# Patient Record
Sex: Male | Born: 1960 | Race: White | Hispanic: No | Marital: Married | Smoking: Current every day smoker
Health system: Southern US, Community
[De-identification: ages and names within clinical notes are randomized; demographics above are authoritative.]

## PROBLEM LIST (undated history)

## (undated) DIAGNOSIS — Z950 Presence of cardiac pacemaker: Secondary | ICD-10-CM

## (undated) HISTORY — PX: APPENDECTOMY: SHX54

## (undated) HISTORY — PX: KNEE ARTHROSCOPY: SHX127

## (undated) HISTORY — PX: PACEMAKER INSERTION: SHX728

## (undated) HISTORY — PX: HERNIA REPAIR: SHX51

---

## 2013-12-14 ENCOUNTER — Other Ambulatory Visit: Payer: Self-pay

## 2013-12-14 ENCOUNTER — Emergency Department (HOSPITAL_COMMUNITY)
Admission: EM | Admit: 2013-12-14 | Discharge: 2013-12-14 | Discharge status: Against Medical Advice | Payer: Self-pay | Attending: Emergency Medicine | Admitting: Emergency Medicine

## 2013-12-14 ENCOUNTER — Encounter (HOSPITAL_COMMUNITY): Payer: Self-pay | Admitting: Emergency Medicine

## 2013-12-14 ENCOUNTER — Emergency Department (HOSPITAL_COMMUNITY): Payer: Self-pay

## 2013-12-14 DIAGNOSIS — F17200 Nicotine dependence, unspecified, uncomplicated: Secondary | ICD-10-CM | POA: Insufficient documentation

## 2013-12-14 DIAGNOSIS — R231 Pallor: Secondary | ICD-10-CM | POA: Insufficient documentation

## 2013-12-14 DIAGNOSIS — R0789 Other chest pain: Secondary | ICD-10-CM | POA: Insufficient documentation

## 2013-12-14 DIAGNOSIS — R531 Weakness: Secondary | ICD-10-CM

## 2013-12-14 DIAGNOSIS — Z950 Presence of cardiac pacemaker: Secondary | ICD-10-CM | POA: Insufficient documentation

## 2013-12-14 DIAGNOSIS — R5381 Other malaise: Secondary | ICD-10-CM | POA: Insufficient documentation

## 2013-12-14 DIAGNOSIS — Z880 Allergy status to penicillin: Secondary | ICD-10-CM | POA: Insufficient documentation

## 2013-12-14 DIAGNOSIS — R5383 Other fatigue: Principal | ICD-10-CM

## 2013-12-14 HISTORY — DX: Presence of cardiac pacemaker: Z95.0

## 2013-12-14 LAB — TROPONIN I

## 2013-12-14 LAB — COMPREHENSIVE METABOLIC PANEL
ALK PHOS: 64 U/L (ref 39–117)
ALT: 15 U/L (ref 0–53)
ANION GAP: 10 (ref 5–15)
AST: 15 U/L (ref 0–37)
Albumin: 4 g/dL (ref 3.5–5.2)
BILIRUBIN TOTAL: 0.5 mg/dL (ref 0.3–1.2)
BUN: 24 mg/dL — AB (ref 6–23)
CHLORIDE: 104 meq/L (ref 96–112)
CO2: 27 mEq/L (ref 19–32)
CREATININE: 0.88 mg/dL (ref 0.50–1.35)
Calcium: 9.2 mg/dL (ref 8.4–10.5)
GFR calc Af Amer: >90 mL/min (ref >90)
GFR calc non Af Amer: >90 mL/min (ref >90)
Glucose, Bld: 89 mg/dL (ref 70–99)
POTASSIUM: 4.3 meq/L (ref 3.7–5.3)
Sodium: 141 mEq/L (ref 137–147)
Total Protein: 6.6 g/dL (ref 6.0–8.3)

## 2013-12-14 LAB — CBC WITH DIFFERENTIAL/PLATELET
BASOS ABS: 0 10*3/uL (ref 0.0–0.1)
Basophils Relative: 0 % (ref 0–1)
EOS ABS: 0.1 10*3/uL (ref 0.0–0.7)
Eosinophils Relative: 2 % (ref 0–5)
HCT: 43.1 % (ref 39.0–52.0)
Hemoglobin: 14.6 g/dL (ref 13.0–17.0)
Lymphocytes Relative: 49 % — ABNORMAL HIGH (ref 12–46)
Lymphs Abs: 3.5 10*3/uL (ref 0.7–4.0)
MCH: 29.3 pg (ref 26.0–34.0)
MCHC: 33.9 g/dL (ref 30.0–36.0)
MCV: 86.5 fL (ref 78.0–100.0)
Monocytes Absolute: 0.5 10*3/uL (ref 0.1–1.0)
Monocytes Relative: 7 % (ref 3–12)
NEUTROS ABS: 3 10*3/uL (ref 1.7–7.7)
NEUTROS PCT: 42 % — AB (ref 43–77)
PLATELETS: 211 10*3/uL (ref 150–400)
RBC: 4.98 MIL/uL (ref 4.22–5.81)
RDW: 13.3 % (ref 11.5–15.5)
WBC: 7.1 10*3/uL (ref 4.0–10.5)

## 2013-12-14 MED ORDER — ACETAMINOPHEN 500 MG PO TABS
1000.0000 mg | ORAL_TABLET | Freq: Once | ORAL | Status: DC
  Filled 2013-12-14: qty 2

## 2013-12-14 MED ORDER — IBUPROFEN 800 MG PO TABS: 800.0000 mg | ORAL_TABLET | Freq: Once | ORAL | Status: DC

## 2013-12-14 NOTE — ED Notes (Signed)
Patient states that he would like his IV removed and that he wants to leave. States that he can take Tylenol and Ibuprofen at home. IV removed. Patient left ambulatory. Dr. Adriana Simasook aware.

## 2013-12-14 NOTE — ED Notes (Signed)
Moved family here, not had any help. Feeling really tired and chest hurting every once in a while. Pacemaker placed in February.

## 2013-12-14 NOTE — ED Provider Notes (Signed)
CSN: [id number]     Arrival date & time 12/14/13  2026 History  This chart was scribed for Brent HutchingBrian Myranda Pavone, MD by Milly JakobJohn Lee Graves, ED Scribe. The patient was seen in room APA10/APA10. Patient's care was started at 8:36 PM.     Chief Complaint  Patient presents with  . Weakness  . Chest Pain    The history is provided by the patient and the spouse. No language interpreter was used.   HPI Comments: Brent Howard is a 53 y.o. male who presents to the Emergency Department complaining of generalized weakness and sharp, intermittent, left upper chest pain. He reports a [facility] [place] and denies taking heart medication. He reports that he has a severe headache. He reports SOB since the pacemaker placement that is worsened today. He reports that he is very tired onset 1 hour ago. He reports a burning and, "sticking," sensation in the area of pacemaker in the left superior lateral chest wall.  He denies having a doctor and reports that he is here visiting relatives. He denies other health problems. He is not a drinker or a smoker.   Past Medical History  Diagnosis Date  . Pacemaker    Past Surgical History  Procedure Laterality Date  . Pacemaker insertion     No family history on file. History  Substance Use Topics  . Smoking status: Current Every Day Smoker  . Smokeless tobacco: Not on file  . Alcohol Use: No    Review of Systems  A complete 10 system review of systems was obtained and all systems are negative except as noted in the HPI and PMH.    Allergies  Penicillins  Home Medications   Prior to Admission medications   Not on File   Traige Vitals: BP 110/79  Pulse 59  Temp(Src) 97.9 F (36.6 C) (Oral)  Resp 14  Ht 5\' 11"  (1.803 m)  Wt 176 lb (79.833 kg)  BMI 24.56 kg/m2  SpO2 96% Physical Exam  Nursing note and vitals reviewed. Constitutional: He is oriented to person, place, and time. He appears  well-developed and well-nourished. No distress.  HENT:  Head: Normocephalic and atraumatic.  Eyes: Conjunctivae and EOM are normal.  Neck: Neck supple. No tracheal deviation present.  Cardiovascular: Normal rate.   Pulmonary/Chest: Effort normal. No respiratory distress. He exhibits tenderness (pacemaker in left superior lateral chest wall. minimally tender around the pacemaker site.).  Musculoskeletal: Normal range of motion.  Neurological: He is alert and oriented to person, place, and time.  Skin: Skin is warm and dry. There is pallor.  Psychiatric: He has a normal mood and affect. His behavior is normal.    ED Course  Procedures (including critical care time) DIAGNOSTIC STUDIES: Oxygen Saturation is 96% on room air, normal by my interpretation.    COORDINATION OF CARE: 8:43 PM-Discussed treatment plan which includes pacemaker interrogation with pt at bedside and pt agreed to plan.   Labs Review Results for orders placed during the hospital encounter of 12/14/13  CBC WITH DIFFERENTIAL      Result Value Ref Range   WBC 7.1  4.0 - 10.5 K/uL   RBC 4.98  4.22 - 5.81 MIL/uL   Hemoglobin 14.6  13.0 - 17.0 g/dL   HCT 13.243.1  44.039.0 - 10.252.0 %   MCV 86.5  78.0 - 100.0 fL   MCH 29.3  26.0 - 34.0 pg   MCHC 33.9  30.0 - 36.0  g/dL   RDW 45.4  09.8 - 11.9 %   Platelets 211  150 - 400 K/uL   Neutrophils Relative % 42 (*) 43 - 77 %   Neutro Abs 3.0  1.7 - 7.7 K/uL   Lymphocytes Relative 49 (*) 12 - 46 %   Lymphs Abs 3.5  0.7 - 4.0 K/uL   Monocytes Relative 7  3 - 12 %   Monocytes Absolute 0.5  0.1 - 1.0 K/uL   Eosinophils Relative 2  0 - 5 %   Eosinophils Absolute 0.1  0.0 - 0.7 K/uL   Basophils Relative 0  0 - 1 %   Basophils Absolute 0.0  0.0 - 0.1 K/uL  COMPREHENSIVE METABOLIC PANEL      Result Value Ref Range   Sodium 141  137 - 147 mEq/L   Potassium 4.3  3.7 - 5.3 mEq/L   Chloride 104  96 - 112 mEq/L   CO2 27  19 - 32 mEq/L   Glucose, Bld 89  70 - 99 mg/dL   BUN 24 (*) 6 - 23  mg/dL   Creatinine, Ser 1.47  0.50 - 1.35 mg/dL   Calcium 9.2  8.4 - 82.9 mg/dL   Total Protein 6.6  6.0 - 8.3 g/dL   Albumin 4.0  3.5 - 5.2 g/dL   AST 15  0 - 37 U/L   ALT 15  0 - 53 U/L   Alkaline Phosphatase 64  39 - 117 U/L   Total Bilirubin 0.5  0.3 - 1.2 mg/dL   GFR calc non Af Amer >90  >90 mL/min   GFR calc Af Amer >90  >90 mL/min   Anion gap 10  5 - 15  TROPONIN I      Result Value Ref Range   Troponin I <0.30  <0.30 ng/mL   No results found.  Imaging Review Dg Chest 2 View  12/14/2013   CLINICAL DATA:  Weakness and chest pain  EXAM: CHEST  2 VIEW  COMPARISON:  None.  FINDINGS: There is a dual-chamber pacer from the left. Lead have an unremarkable orientation. No definitive cardiomegaly. Upper mediastinal contours are unremarkable. No edema, consolidation, effusion, or pneumothorax. A density projecting posterior and inferior to the carina in the lateral projection is likely central pulmonary vessels. No nodule seen in the frontal projection.  IMPRESSION: No active cardiopulmonary disease.   Electronically Signed   By: Tiburcio Pea M.D.   On: 12/14/2013 22:33     EKG Interpretation None      Date: 12/14/2013  Rate: 60  Rhythm: normal sinus rhythm  QRS Axis: normal  Intervals: normal  ST/T Wave abnormalities: normal  Conduction Disutrbances: none  Narrative Interpretation: unremarkable    MDM   Final diagnoses:  Weakness   Pt left AMA.  I attempted to get him to stay to finish his workup, but he refused.  I was in the process of reviewing his pacemaker interrogation. Pt understands the seriousness of his problem. I personally performed the services described in this documentation, which was scribed in my presence. The recorded information has been reviewed and is accurate.    Brent Hutching, MD 12/14/13 [phone]

## 2013-12-14 NOTE — ED Notes (Signed)
Patient states that he has a headache that started an hour ago. Also has an insect bite to lower left chest.

## 2014-01-19 ENCOUNTER — Emergency Department (HOSPITAL_COMMUNITY)
Admission: EM | Admit: 2014-01-19 | Discharge: 2014-01-19 | Disposition: A | Discharge status: Home / Self Care | Payer: Self-pay | Attending: Emergency Medicine | Admitting: Emergency Medicine

## 2014-01-19 ENCOUNTER — Encounter (HOSPITAL_COMMUNITY): Payer: Self-pay | Admitting: Emergency Medicine

## 2014-01-19 DIAGNOSIS — W57XXXA Bitten or stung by nonvenomous insect and other nonvenomous arthropods, initial encounter: Secondary | ICD-10-CM

## 2014-01-19 DIAGNOSIS — IMO0002 Reserved for concepts with insufficient information to code with codable children: Secondary | ICD-10-CM | POA: Insufficient documentation

## 2014-01-19 DIAGNOSIS — T63484A Toxic effect of venom of other arthropod, undetermined, initial encounter: Secondary | ICD-10-CM

## 2014-01-19 DIAGNOSIS — T63461A Toxic effect of venom of wasps, accidental (unintentional), initial encounter: Secondary | ICD-10-CM | POA: Insufficient documentation

## 2014-01-19 DIAGNOSIS — Y929 Unspecified place or not applicable: Secondary | ICD-10-CM | POA: Insufficient documentation

## 2014-01-19 DIAGNOSIS — Z880 Allergy status to penicillin: Secondary | ICD-10-CM | POA: Insufficient documentation

## 2014-01-19 DIAGNOSIS — Y939 Activity, unspecified: Secondary | ICD-10-CM | POA: Insufficient documentation

## 2014-01-19 DIAGNOSIS — Z950 Presence of cardiac pacemaker: Secondary | ICD-10-CM | POA: Insufficient documentation

## 2014-01-19 DIAGNOSIS — F17200 Nicotine dependence, unspecified, uncomplicated: Secondary | ICD-10-CM | POA: Insufficient documentation

## 2014-01-19 DIAGNOSIS — T6391XA Toxic effect of contact with unspecified venomous animal, accidental (unintentional), initial encounter: Secondary | ICD-10-CM | POA: Insufficient documentation

## 2014-01-19 MED ORDER — PREDNISONE 10 MG PO TABS
60.0000 mg | ORAL_TABLET | Freq: Once | ORAL | Status: AC
  Administered 2014-01-19: 60 mg via ORAL
  Filled 2014-01-19 (×2): qty 1

## 2014-01-19 MED ORDER — PREDNISONE 10 MG PO TABS: ORAL_TABLET | ORAL | Status: DC

## 2014-01-19 MED ORDER — HYDROCODONE-ACETAMINOPHEN 5-325 MG PO TABS
1.0000 | ORAL_TABLET | Freq: Once | ORAL | Status: AC
  Administered 2014-01-19: 1 via ORAL
  Filled 2014-01-19: qty 1

## 2014-01-19 MED ORDER — HYDROCODONE-ACETAMINOPHEN 5-325 MG PO TABS: 1.0000 | ORAL_TABLET | ORAL | Status: DC | PRN

## 2014-01-19 NOTE — ED Notes (Signed)
Pt with swelling to left forearm from where a bee stung him this morning, pt states he has taken 4 benadryl today and applied ice to site, refused ice pack here, c/o pain to site, pt denies SOB or tongue swelling

## 2014-01-19 NOTE — ED Provider Notes (Signed)
CSN: [id number]     Arrival date & time 01/19/14  1829 History   First MD Initiated Contact with Patient 01/19/14 1853     Chief Complaint  Patient presents with  . Insect Bite     (Consider location/radiation/quality/duration/timing/severity/associated sxs/prior Treatment) The history is provided by the patient.   Brent Howard is a 53 y.o. male presenting with a painful insect sting to his left medial forearm occurring around 9 AM this morning.  He did not see the insect but suspects it was either a bee or hornet.  He has applied ice to the area and has also taking 4 doses of Benadryl today.  He continues to be red, swollen and also itchy and very tender without radiation of pain.  The swelling is limited to his medial mid forearm.  He denies shortness of breath, wheezing, mouth or facial swelling.  He denies allergy to bee stings.     Past Medical History  Diagnosis Date  . Pacemaker    Past Surgical History  Procedure Laterality Date  . Pacemaker insertion    . Hernia repair    . Knee arthroscopy    . Appendectomy     History reviewed. No pertinent family history. History  Substance Use Topics  . Smoking status: Current Every Day Smoker -- 0.75 packs/day  . Smokeless tobacco: Not on file  . Alcohol Use: No    Review of Systems  Constitutional: Negative for fever and chills.  Respiratory: Negative for shortness of breath and wheezing.   Skin: Positive for color change. Negative for rash.  Neurological: Negative for numbness.      Allergies  Penicillins  Home Medications   Prior to Admission medications   Medication Sig Start Date End Date Taking? Authorizing Provider  HYDROcodone-acetaminophen (NORCO/VICODIN) 5-325 MG per tablet Take 1 tablet by mouth every 4 (four) hours as needed for moderate pain. 01/19/14   Burgess Amor, PA-C  predniSONE (DELTASONE) 10 MG tablet 6, 5, 4, 3, 2 then 1 tablet by mouth daily for 6 days total. 01/19/14   Burgess Amor, PA-C   BP  121/79  Pulse 80  Temp(Src) 99 F (37.2 C) (Oral)  Resp 20  Ht 5\' 10"  (1.778 m)  Wt 179 lb (81.194 kg)  BMI 25.68 kg/m2  SpO2 98% Physical Exam  Constitutional: He appears well-developed and well-nourished. No distress.  HENT:  Head: Normocephalic.  Mouth/Throat: Uvula is midline and oropharynx is clear and moist.  Neck: Neck supple.  Cardiovascular: Normal rate.   Pulmonary/Chest: Effort normal. He has no decreased breath sounds. He has no wheezes.  No stridor  Musculoskeletal: Normal range of motion. He exhibits no edema.  Skin: Skin is warm. There is erythema.  Left volar forearm is moderately tender, slightly edematous mid forearm without red streaking or induration.  It is slightly pink in coloration.  There is a central tiny punctum without retained foreign body.    ED Course  Procedures (including critical care time) Labs Review Labs Reviewed - No data to display  Imaging Review No results found.   EKG Interpretation None      MDM   Final diagnoses:  Insect bites and stings, undetermined intent, initial encounter    Patient was encouraged to continue with ice packs and Benadryl.  He was prescribed a prednisone 6 day taper with first dose given here.  Also prescribed a few hydrocodone tablets.  He is in no respiratory distress.  He displays localized reaction to an insect  sting without complication.  Encouraged recheck here for any worsened symptoms.    Burgess AmorJulie Fleda Pagel, PA-C 01/19/14 2020

## 2014-01-19 NOTE — ED Notes (Signed)
Pt verbalized understanding of no driving within 4 hours of taking pain med due to med may cause drowsiness  

## 2014-01-19 NOTE — Discharge Instructions (Signed)
Bee, Wasp, or Hornet Sting Your caregiver has diagnosed you as having an insect sting. An insect sting appears as a red lump in the skin that sometimes has a tiny hole in the center, or it may have a stinger in the center of the wound. The most common stings are from wasps, hornets and bees. Individuals have different reactions to insect stings.  A normal reaction may cause pain, swelling, and redness around the sting site.  A localized allergic reaction may cause swelling and redness that extends beyond the sting site.  A large local reaction may continue to develop over the next 12 to 36 hours.  On occasion, the reactions can be severe (anaphylactic reaction). An anaphylactic reaction may cause wheezing; difficulty breathing; chest pain; fainting; raised, itchy, red patches on the skin; a sick feeling to your stomach (nausea); vomiting; cramping; or diarrhea. If you have had an anaphylactic reaction to an insect sting in the past, you are more likely to have one again. HOME CARE INSTRUCTIONS   With bee stings, a small sac of poison is left in the wound. Brushing across this with something such as a credit card, or anything similar, will help remove this and decrease the amount of the reaction. This same procedure will not help a wasp sting as they do not leave behind a stinger and poison sac.  Apply a cold compress for 10 to 20 minutes every hour for 1 to 2 days, depending on severity, to reduce swelling and itching.  To lessen pain, a paste made of water and baking soda may be rubbed on the bite or sting and left on for 5 minutes.  To relieve itching and swelling, you may use take medication or apply medicated creams or lotions as directed.  Only take over-the-counter or prescription medicines for pain, discomfort, or fever as directed by your caregiver.  Wash the sting site daily with soap and water. Apply antibiotic ointment on the sting site as directed.  If you suffered a severe  reaction:  If you did not require hospitalization, an adult will need to stay with you for 24 hours in case the symptoms return.  You may need to wear a medical bracelet or necklace stating the allergy.  You and your family need to learn when and how to use an anaphylaxis kit or epinephrine injection.  If you have had a severe reaction before, always carry your anaphylaxis kit with you. SEEK MEDICAL CARE IF:   None of the above helps within 2 to 3 days.  The area becomes red, warm, tender, and swollen beyond the area of the bite or sting.  You have an oral temperature above 102 F (38.9 C). SEEK IMMEDIATE MEDICAL CARE IF:  You have symptoms of an allergic reaction which are:  Wheezing.  Difficulty breathing.  Chest pain.  Lightheadedness or fainting.  Itchy, raised, red patches on the skin.  Nausea, vomiting, cramping or diarrhea. ANY OF THESE SYMPTOMS MAY REPRESENT A SERIOUS PROBLEM THAT IS AN EMERGENCY. Do not wait to see if the symptoms will go away. Get medical help right away. Call your local emergency services (911 in [place] DO NOT drive yourself to the hospital. MAKE SURE YOU:   Understand these instructions.  Will watch your condition.  Will get help right away if you are not doing well or get worse. Document Released: 05/19/2005 Document Revised: 08/11/2011 Document Reviewed: 11/03/2009 [facility] Patient Information 2015 [place], [place]. This information is not intended to replace advice given  to you by your health care provider. Make sure you discuss any questions you have with your health care provider.   Take your next dose of prednisone tomorrow evening. Use caution with hydrocodone as this medication will make you drowsy.  Continue using your Benadryl and tell itching and swelling is improved.  Ice packs are also recommended and you may continue using this treatment.  Return here for any worsened symptoms.

## 2014-01-19 NOTE — ED Notes (Signed)
Pt reports was stung by a bee at 9 am this morning. Pt reports took x4 benadryl with no relief. Pt reports nausea. Airway patent. nad noted. Mild swelling and redness noted to left forearm.

## 2014-01-20 NOTE — ED Provider Notes (Signed)
Medical screening examination/treatment/procedure(s) were performed by non-physician practitioner and as supervising physician I was immediately available for consultation/collaboration.   EKG Interpretation None        Vanetta MuldersScott Bernyce Brimley, MD 01/20/14 [id number]

## 2014-03-24 ENCOUNTER — Emergency Department (HOSPITAL_COMMUNITY)
Admission: EM | Admit: 2014-03-24 | Discharge: 2014-03-24 | Disposition: A | Discharge status: Home / Self Care | Payer: Self-pay | Attending: Emergency Medicine | Admitting: Emergency Medicine

## 2014-03-24 ENCOUNTER — Emergency Department (HOSPITAL_COMMUNITY): Payer: Self-pay

## 2014-03-24 ENCOUNTER — Encounter (HOSPITAL_COMMUNITY): Payer: Self-pay | Admitting: Emergency Medicine

## 2014-03-24 DIAGNOSIS — Z950 Presence of cardiac pacemaker: Secondary | ICD-10-CM | POA: Insufficient documentation

## 2014-03-24 DIAGNOSIS — R42 Dizziness and giddiness: Secondary | ICD-10-CM

## 2014-03-24 DIAGNOSIS — R51 Headache: Secondary | ICD-10-CM | POA: Insufficient documentation

## 2014-03-24 DIAGNOSIS — Z880 Allergy status to penicillin: Secondary | ICD-10-CM | POA: Insufficient documentation

## 2014-03-24 DIAGNOSIS — R112 Nausea with vomiting, unspecified: Secondary | ICD-10-CM | POA: Insufficient documentation

## 2014-03-24 DIAGNOSIS — J069 Acute upper respiratory infection, unspecified: Secondary | ICD-10-CM | POA: Insufficient documentation

## 2014-03-24 DIAGNOSIS — I959 Hypotension, unspecified: Secondary | ICD-10-CM | POA: Insufficient documentation

## 2014-03-24 DIAGNOSIS — M549 Dorsalgia, unspecified: Secondary | ICD-10-CM | POA: Insufficient documentation

## 2014-03-24 DIAGNOSIS — Z720 Tobacco use: Secondary | ICD-10-CM | POA: Insufficient documentation

## 2014-03-24 DIAGNOSIS — Z79899 Other long term (current) drug therapy: Secondary | ICD-10-CM | POA: Insufficient documentation

## 2014-03-24 LAB — CBC WITH DIFFERENTIAL/PLATELET
Basophils Absolute: 0 10*3/uL (ref 0.0–0.1)
Basophils Relative: 0 % (ref 0–1)
Eosinophils Absolute: 0 10*3/uL (ref 0.0–0.7)
Eosinophils Relative: 0 % (ref 0–5)
HEMATOCRIT: 35.9 % — AB (ref 39.0–52.0)
Hemoglobin: 12.2 g/dL — ABNORMAL LOW (ref 13.0–17.0)
LYMPHS ABS: 2 10*3/uL (ref 0.7–4.0)
Lymphocytes Relative: 21 % (ref 12–46)
MCH: 29.5 pg (ref 26.0–34.0)
MCHC: 34 g/dL (ref 30.0–36.0)
MCV: 86.7 fL (ref 78.0–100.0)
MONO ABS: 0.7 10*3/uL (ref 0.1–1.0)
Monocytes Relative: 7 % (ref 3–12)
NEUTROS ABS: 6.7 10*3/uL (ref 1.7–7.7)
NEUTROS PCT: 72 % (ref 43–77)
Platelets: 196 10*3/uL (ref 150–400)
RBC: 4.14 MIL/uL — AB (ref 4.22–5.81)
RDW: 13.5 % (ref 11.5–15.5)
WBC: 9.4 10*3/uL (ref 4.0–10.5)

## 2014-03-24 LAB — COMPREHENSIVE METABOLIC PANEL
ALK PHOS: 54 U/L (ref 39–117)
ALT: 12 U/L (ref 0–53)
AST: 10 U/L (ref 0–37)
Albumin: 3 g/dL — ABNORMAL LOW (ref 3.5–5.2)
Anion gap: 11 (ref 5–15)
BILIRUBIN TOTAL: 0.4 mg/dL (ref 0.3–1.2)
BUN: 13 mg/dL (ref 6–23)
CHLORIDE: 105 meq/L (ref 96–112)
CO2: 25 meq/L (ref 19–32)
CREATININE: 1.08 mg/dL (ref 0.50–1.35)
Calcium: 8.3 mg/dL — ABNORMAL LOW (ref 8.4–10.5)
GFR, EST AFRICAN AMERICAN: 89 mL/min — AB (ref >90)
GFR, EST NON AFRICAN AMERICAN: 77 mL/min — AB (ref >90)
GLUCOSE: 116 mg/dL — AB (ref 70–99)
POTASSIUM: 4.3 meq/L (ref 3.7–5.3)
Sodium: 141 mEq/L (ref 137–147)
Total Protein: 5.5 g/dL — ABNORMAL LOW (ref 6.0–8.3)

## 2014-03-24 LAB — URINE MICROSCOPIC-ADD ON

## 2014-03-24 LAB — URINALYSIS, ROUTINE W REFLEX MICROSCOPIC
Glucose, UA: NEGATIVE mg/dL
HGB URINE DIPSTICK: NEGATIVE
KETONES UR: NEGATIVE mg/dL
Leukocytes, UA: NEGATIVE
NITRITE: NEGATIVE
PROTEIN: 30 mg/dL — AB
UROBILINOGEN UA: 1 mg/dL (ref 0.0–1.0)
pH: 6 (ref 5.0–8.0)

## 2014-03-24 LAB — RAPID URINE DRUG SCREEN, HOSP PERFORMED
Amphetamines: NOT DETECTED
BARBITURATES: NOT DETECTED
Benzodiazepines: NOT DETECTED
COCAINE: NOT DETECTED
Opiates: NOT DETECTED
TETRAHYDROCANNABINOL: NOT DETECTED

## 2014-03-24 LAB — TROPONIN I

## 2014-03-24 LAB — ETHANOL

## 2014-03-24 MED ORDER — CYCLOBENZAPRINE HCL 10 MG PO TABS: 10.0000 mg | ORAL_TABLET | Freq: Two times a day (BID) | ORAL | Status: AC | PRN

## 2014-03-24 MED ORDER — ONDANSETRON HCL 4 MG/2ML IJ SOLN
4.0000 mg | Freq: Once | INTRAMUSCULAR | Status: AC
  Administered 2014-03-24: 4 mg via INTRAVENOUS
  Filled 2014-03-24: qty 2

## 2014-03-24 MED ORDER — MORPHINE SULFATE 4 MG/ML IJ SOLN
4.0000 mg | Freq: Once | INTRAMUSCULAR | Status: AC
  Administered 2014-03-24: 4 mg via INTRAVENOUS
  Filled 2014-03-24: qty 1

## 2014-03-24 MED ORDER — AZITHROMYCIN 250 MG PO TABS: ORAL_TABLET | ORAL | Status: AC

## 2014-03-24 MED ORDER — HYDROCODONE-ACETAMINOPHEN 5-325 MG PO TABS: 1.0000 | ORAL_TABLET | ORAL | Status: AC | PRN

## 2014-03-24 NOTE — ED Notes (Signed)
Interrogated medtronic pacer.

## 2014-03-24 NOTE — ED Notes (Signed)
Patient with no complaints at this time. Respirations even and unlabored. Skin warm/dry. Discharge instructions reviewed with patient at this time. Patient given opportunity to voice concerns/ask questions. IV removed per policy and band-aid applied to site. Patient discharged at this time and left Emergency Department with steady gait.  

## 2014-03-24 NOTE — ED Notes (Signed)
See arrival note

## 2014-03-24 NOTE — ED Notes (Signed)
Pacemaker was placed in Feb 2015.  Wife states he was checked in April and battery life had dropped 3 years.

## 2014-03-24 NOTE — ED Notes (Signed)
Report from [employer] recv'd. Report given to Dr. Adriana Simasook.

## 2014-03-24 NOTE — Discharge Instructions (Signed)
Pacemaker battery is working normally. You need to get a primary care Dr.    Chest x-ray shows the possibility of a mild infection. Prescription for antibiotic, pain medication, muscle relaxer.    Emergency Department Resource Guide 1) Find a Doctor and Pay Out of Pocket Although you won't have to find out who is covered by your insurance plan, it is a good idea to ask around and get recommendations. You will then need to call the office and see if the doctor you have chosen will accept you as a new patient and what types of options they offer for patients who are self-pay. Some doctors offer discounts or will set up payment plans for their patients who do not have insurance, but you will need to ask so you aren't surprised when you get to your appointment.  2) Contact Your Local Health Department Not all health departments have doctors that can see patients for sick visits, but many do, so it is worth a call to see if yours does. If you don't know where your local health department is, you can check in your phone book. The CDC also has a tool to help you locate your state's health department, and many state websites also have listings of all of their local health departments.  3) Find a Walk-in Clinic If your illness is not likely to be very severe or complicated, you may want to try a walk in clinic. These are popping up all over the country in pharmacies, drugstores, and shopping centers. They're usually staffed by nurse practitioners or physician assistants that have been trained to treat common illnesses and complaints. They're usually fairly quick and inexpensive. However, if you have serious medical issues or chronic medical problems, these are probably not your best option.  No Primary Care Doctor: - Call Health Connect at  [phone] - they can help you locate a primary care doctor that  accepts your insurance, provides certain services, etc. - Physician Referral Service-  [phone]  Chronic Pain Problems: Organization         Address  Phone   Notes  [facility]  [phone]) [phone] Patients need to be referred by their primary care doctor.   Medication Assistance: Organization         Address  Phone   Notes  [facility] [address] [place], [place] [address] [phone]) [phone] --Must be a resident of [place] -- Must have NO insurance coverage whatsoever (no Medicaid/ Medicare, etc.) -- The pt. MUST have a primary care doctor that directs their care regularly and follows them in the community   MedAssist  [phone]   [employer]  [phone]    Agencies that provide inexpensive medical care: Organization         Address  Phone   Notes  [facility]  [phone]   [facility]    [phone]   [facility] [address] [place], [place] [address] [phone]) [phone]   [facility] [address], [place] [phone]   [facility]    [phone](336) [phone]   [facility]    [phone]) [phone]   [facility]  [address], [place] Phone:  [phone], Fax:  [phone] Hours of Operation:  9 am - 6 pm, M-F.  Also accepts Medicaid/Medicare and self-pay.  [facility]  [address] [place], [address], [place] Phone: [phone], Fax: [phone]. Hours of Operation:  8:30 am - 5:30 pm, M-F.  Also accepts Medicaid and self-pay.  [facility] [address], [place] Phone: [phone]   [place], [place], [place] [phone], Ext. [phone] Mondays & Thursdays: 7-9 AM.  First 15 patients are seen on a first come, first serve basis.    [place] Providers:  Organization         Address  Phone   Notes  [facility] [address],  [place] [phone] Also accepts self-pay patients.  [facility] [address], [place]  [phone]   [place], [address], [place] [phone]   [facility] [address] [place] [phone]   Lucianne Lei [address], [place]   [phone] Only accepts [place] Access [place] patients after they have their name applied to their card.   Self-Pay (no insurance) in [facility]:  Organization         Address  Phone   Notes  Sickle Cell Patients, [facility] [place] [phone]   [facility] [place] [phone]   [facility]  [place], [place], [place] [phone]   [facility]/Dr. Osei-Bonsu  [address], [place] or [address], [place] [phone] Phone number for both [place] and [place] locations is the same.  [facility] [address], [place] [phone]   [facility] [address] [place] or [address] [phone]   [facility] [address] [place] [phone], phone; [phone], fax Sees patients 1st and 3rd Saturday of every month.  Must not qualify for public or private insurance (i.e. Medicaid, Medicare, [place] Health Choice, Veterans' Benefits)  Household income should be no more than 200% of the poverty level The clinic cannot treat you if you are pregnant or think you are pregnant  Sexually transmitted diseases are not treated at the clinic.    Dental Care: Organization         Address  Phone  Notes  [facility] Department of [facility] [place] [phone] Accepts children up to age 50 who are enrolled in  [place] or [place]; pregnant women with a Medicaid card; and children who have applied for Medicaid or [place] Health Choice, but were declined, whose parents can pay a reduced fee at time of service.  [facility] Department of [facility]  [address], [place] [phone] Accepts children up to age 26 who are enrolled in [place] or [place]; pregnant women with a Medicaid card; and children who have applied for Medicaid or [place] Health Choice, but were declined, whose parents can pay a reduced fee at time of service.  [facility] [place] [facility] Access PROGRAM  [place] [phone] Patients are seen by appointment only. Walk-ins are not accepted. [place] will see patients 70 years of age and older. Monday - Tuesday (8am-5pm) Most Wednesdays (8:30-5pm) $30 per visit, cash only  [place] Adult  Dental Access PROGRAM  [address], [facility] [phone] Patients are seen by appointment only. Walk-ins are not accepted. [place] will see patients 42 years of age and older. One Wednesday Evening (Monthly: Volunteer Based).  $30 per visit, cash only  [place]  [phone] for adults; Children under age 54, call Graduate Pediatric Dentistry at [phone]. Children aged 65-14, please call [phone] to request a pediatric application.  Dental services are provided in all areas of dental care including fillings, crowns and bridges, complete and partial dentures, implants, gum treatment, root canals, and extractions. Preventive care is also provided. Treatment is provided to both adults and children. Patients are selected via a lottery and there is often a waiting list.   [facility] [address] [place]  [phone] [email or web address]   [facility] [address] [place], [place] [phone], Ext. [phone] Second and Fourth Thursday of each month, opens at 6:30  AM; Clinic ends at 9 AM.  Patients are seen on a first-come first-served basis, and a limited number are seen during each clinic.   [facility]  [address] Hillard Danker [place], [place] [phone]   Eligibility Requirements You must have lived in [place], [place], or [place] counties for at least the last three months.   You cannot be eligible for state or federal sponsored [employer], including [employer], [place], or [employer].   You generally cannot be eligible for healthcare insurance through your employer.    How to apply: Eligibility screenings are held every Tuesday and Wednesday afternoon from 1:00 pm until 4:00 pm. You do not need an appointment for the interview!  [facility] [address] [place], [place]   [place]  [place] Department  [place]  [phone]    Behavioral Health Resources in the Community: Intensive Outpatient Programs Organization         Address  Phone  Notes  [place]. [address], [place], [place] [phone]   [facility] Outpatient [address], [place], [place]   ADS: Alcohol & Drug Svcs [address] [place], [place]   [place] [address]  [place], [place] or [phone]   Substance Abuse Resources Organization         Address  Phone  Notes  Alcohol and Drug Services  [phone]   [place]  [phone]   The [place]   Chinita Pester  [phone]   Residential & Outpatient Substance Abuse Program  [phone]   Psychological Services Organization         Address  Phone  Notes  [facility] [place]  [place]  [phone]   [place] [address], [place] [phone] or  [phone]    Mobile Crisis Teams Organization         Address  Phone  Notes  [facility], Mobile Crisis Care Unit  [phone]   [facility]  [address] [place], [place]   [place] Levels [address] [place] [phone]    Self-Help/Support Groups Organization         Address  Phone             Notes  Mental  Health Assoc[facility] of [place] - variety of support groups  [place] Call for more information  Narcotics Anonymous (NA), Caring [facility] [address], [employer] [place]  2 meetings at this location   Special educational needs teacher         Address  Phone  Notes  [facility] Residential Treatment [place],    [place]  [phone]   [facility]  [address] [place] [address], [place], [place]   [place], [place] [phone] Admissions: 8am-3pm M-F  Incentives Substance [place] [address]    [place], [place] [phone]   [facility] [address] [place], [place], [place]   [facility] [address],  [place], [place]   Insight Programs - Intensive Outpatient [address], [place], [place]   [facility] [address].,  [place], [place] [phone] or [phone]   [facility] Services (RTS) [address], [place], [place] Accepts Medicaid  [facility] [address]  [place] [phone] Substance Abuse/Addiction Treatment   [facility] Organization         Address  Phone  Notes  [facility]  [phone]   Domenic Schwab, PhD [address] Arlis Porta [place], [place]   [phone] or [phone]   [facility], [place] [phone]   [facility] [address],  [place], [place] [phone] Insurance/Medicaid/sponsorship through [facility] and Families [address], [address]                                    [place], [place] [phone] [place] [address], [place] [phone]    Dr. Adele Schilder  [phone]   [facility] Dept. 1) [address] [place] 2) [place] 3)  [place], [place] [phone]  [phone]   [place] [phone] or [phone] (After Hours)

## 2014-03-24 NOTE — ED Notes (Signed)
Patient w/n/v/vertigo x 2 days.  States when he changes positions, objects around him seem to move.  States he has vomited approximately 10 x in 2 days.  States his wife told him that yesterday, he had period where he was having visual hallucinations, talking to fish on the floor and to deceased family members.  Patient has no recall of this.  Over past few weeks, he has been having periods of disequilibrium with sudden directional changes when walking, causing him to stumble.  Also reports having spell of whole body numbness/tingling yesterday and recent episodes of waking at night, feeling as if L side of face is pulling.  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  He has tenderness w/palpation in cervical spine and pain radiating to L and R shoulders.  He was recently lifting heavy sheetrock and pain began afterward.  He denies use of any illegal substance or alcohol use.  Denies fevers/chills, chest pain.

## 2014-03-24 NOTE — ED Provider Notes (Addendum)
CSN: [id number]     Arrival date & time 03/24/14  1402 History   First MD Initiated Contact with Patient 03/24/14 1504     This chart was scribed for Donnetta HutchingBrian Ailyn Gladd, MD by Arlan OrganAshley Leger, ED Scribe. This patient was seen in room APA10/APA10 and the patient's care was started 5:52 PM.   Chief Complaint  Patient presents with  . Dizziness  . Emesis  . Nausea   The history is provided by the patient. No language interpreter was used.    HPI Comments: Brent Howard is a 53 y.o. male who presents to the Emergency Department complaining of constant, moderate pain between scapula and upper back x few days. Pt states pain radiates into his head. No recent injury or trauma. However, he reports recently moving a large amount of plywood and 2 x 4's.   He also admits to occasional dizziness and URI symptoms. 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 He denies any CP, fever, chills, worsening dyspnea at this time. Pt with known allergy to Penicillins. No other concerns.  Patient states his normal systolic blood pressure is 90  Past Medical History  Diagnosis Date  . Pacemaker    Past Surgical History  Procedure Laterality Date  . Pacemaker insertion    . Hernia repair    . Knee arthroscopy    . Appendectomy     History reviewed. No pertinent family history. History  Substance Use Topics  . Smoking status: Current Every Day Smoker -- 0.75 packs/day  . Smokeless tobacco: Not on file  . Alcohol Use: No    Review of Systems  Respiratory: Positive for shortness of breath.   Cardiovascular: Negative for chest pain.  Musculoskeletal: Positive for arthralgias.  Skin: Negative for color change.  All other systems reviewed and are negative.     Allergies  Penicillins  Home Medications   Prior to Admission medications   Medication Sig Start Date End Date Taking? Authorizing Provider  azithromycin (ZITHROMAX Z-PAK) 250 MG tablet  2 po day one, then 1 daily x 4 days 03/24/14   Donnetta HutchingBrian Karion Cudd, MD  cyclobenzaprine (FLEXERIL) 10 MG tablet Take 1 tablet (10 mg total) by mouth 2 (two) times daily as needed for muscle spasms. 03/24/14   Donnetta HutchingBrian Riaan [place], MD  HYDROcodone-acetaminophen (NORCO) 5-325 MG per tablet Take 1-2 tablets by mouth every 4 (four) hours as needed. 03/24/14   Donnetta HutchingBrian Ly Bacchi, MD   Triage Vitals: BP 86/53  Pulse 58  Temp(Src) 97.3 F (36.3 C) (Oral)  Resp 19  Ht 5\' 10"  (1.778 m)  Wt 180 lb (81.647 kg)  BMI 25.83 kg/m2  SpO2 96%   Physical Exam  Nursing note and vitals reviewed. Constitutional: He is oriented to person, place, and time. He appears well-developed and well-nourished.  Hypotensive but normal for pt  HENT:  Head: Normocephalic and atraumatic.  Eyes: Conjunctivae and EOM are normal. Pupils are equal, round, and reactive to light.  Neck: Normal range of motion. Neck supple.  Cardiovascular: Normal rate, regular rhythm and normal heart sounds.   Pulmonary/Chest: Effort normal and breath sounds normal.  Abdominal: Soft. Bowel sounds are normal.  Musculoskeletal: Normal range of motion. He exhibits tenderness.  Tenderness to palpation between scapula and upper back  Neurological: He is alert and oriented to person, place, and time.  Skin: Skin is warm and dry.  Psychiatric: He has a normal mood and affect. His behavior is normal.  ED Course  Procedures (including critical care time)  DIAGNOSTIC STUDIES: Oxygen Saturation is 97% on RA, Adequate by my interpretation.    COORDINATION OF CARE: 5:52 PM- Will order CBC, CMP, Troponin I, urinalysis, CT heat without contrast, DG chest 2 view, and EKG. Will give Morphine and Zofran here in ED. Discussed treatment plan with pt at bedside and pt agreed to plan.     Labs Review Labs Reviewed  CBC WITH DIFFERENTIAL - Abnormal; Notable for the following:    RBC 4.14 (*)    Hemoglobin 12.2 (*)    HCT 35.9 (*)    All other components within normal limits   COMPREHENSIVE METABOLIC PANEL - Abnormal; Notable for the following:    Glucose, Bld 116 (*)    Calcium 8.3 (*)    Total Protein 5.5 (*)    Albumin 3.0 (*)    GFR calc non Af Amer 77 (*)    GFR calc Af Amer 89 (*)    All other components within normal limits  URINALYSIS, ROUTINE W REFLEX MICROSCOPIC - Abnormal; Notable for the following:    Specific Gravity, Urine >1.030 (*)    Bilirubin Urine SMALL (*)    Protein, ur 30 (*)    All other components within normal limits  URINE MICROSCOPIC-ADD ON - Abnormal; Notable for the following:    Bacteria, UA FEW (*)    All other components within normal limits  TROPONIN I  URINE RAPID DRUG SCREEN (HOSP PERFORMED)  ETHANOL    Imaging Review Dg Chest 2 View  03/24/2014   CLINICAL DATA:  Back pain.  EXAM: CHEST  2 VIEW  COMPARISON:  12/14/2013.  FINDINGS: Mediastinum hilar structures are normal. Mild basilar atelectasis and/or infiltrates. No pleural effusion or pneumothorax. Cardiac pacer and lead tips in right atrium right ventricle. Heart size stable. Normal pulmonary vascularity. No acute bony abnormality.  IMPRESSION: 1.  Mild bibasilar atelectasis and/or infiltrates .  2. Stable cardiomegaly. Normal pulmonary vascularity. Cardiac pacer with lead tips in the right atrium and right ventricle.   Electronically Signed   By: Maisie Fushomas  Register   On: 03/24/2014 16:14   Ct Head Wo Contrast  03/24/2014   CLINICAL DATA:  Vertigo. Nausea and vomiting. Headache. Headache for 2 days. Initial encounter.  EXAM: CT HEAD WITHOUT CONTRAST  TECHNIQUE: Contiguous axial images were obtained from the base of the skull through the vertex without intravenous contrast.  COMPARISON:  None.  FINDINGS: Scout images appear within normal limits. Mild soft tissue swelling is present in the RIGHT forehead which is asymmetric. This could represent contusion or an old injury. There is no given history of acute trauma. There is no skull fracture. No mass lesion, mass effect,  midline shift, hydrocephalus, hemorrhage. No territorial ischemia or acute infarction. Mastoid air cells are clear.  IMPRESSION: Negative CT head.   Electronically Signed   By: Andreas NewportGeoffrey  Lamke M.D.   On: 03/24/2014 15:02     EKG Interpretation   Date/Time:  Friday March 24 2014 14:12:54 EDT Ventricular Rate:  71 PR Interval:  142 QRS Duration: 94 QT Interval:  417 QTC Calculation: 453 R Axis:   54 Text Interpretation:  Sinus rhythm Inferior infarct, old Confirmed by Druscilla Petsch   MD, Faria Casella ([id number]) on 03/24/2014 4:44:57 PM      MDM   Final diagnoses:  Back pain  URI (upper respiratory infection)    Patient is exquisitely tender in his upper back between the scapula. He is noted to have done  excess amount of manual labor thelast couple days with associated muscular tenderness is upper back. Pacemaker was interrogated and battery is working normally.  Discharge medications Zithromax, Norco, Flexeril 10 mg.  Encouraged to get primary care followup  I personally performed the services described in this documentation, which was scribed in my presence. The recorded information has been reviewed and is accurate.    Donnetta Hutching, MD 03/24/14 1749  Donnetta Hutching, MD 03/24/14 [phone]

## 2015-03-08 IMAGING — CR DG CHEST 2V
2 series · 2 of 2 positions shown · non-contrast
Comparison: None.

CLINICAL DATA: Weakness and chest pain

EXAM:
CHEST  2 VIEW

[view not recorded (1 of 2)]
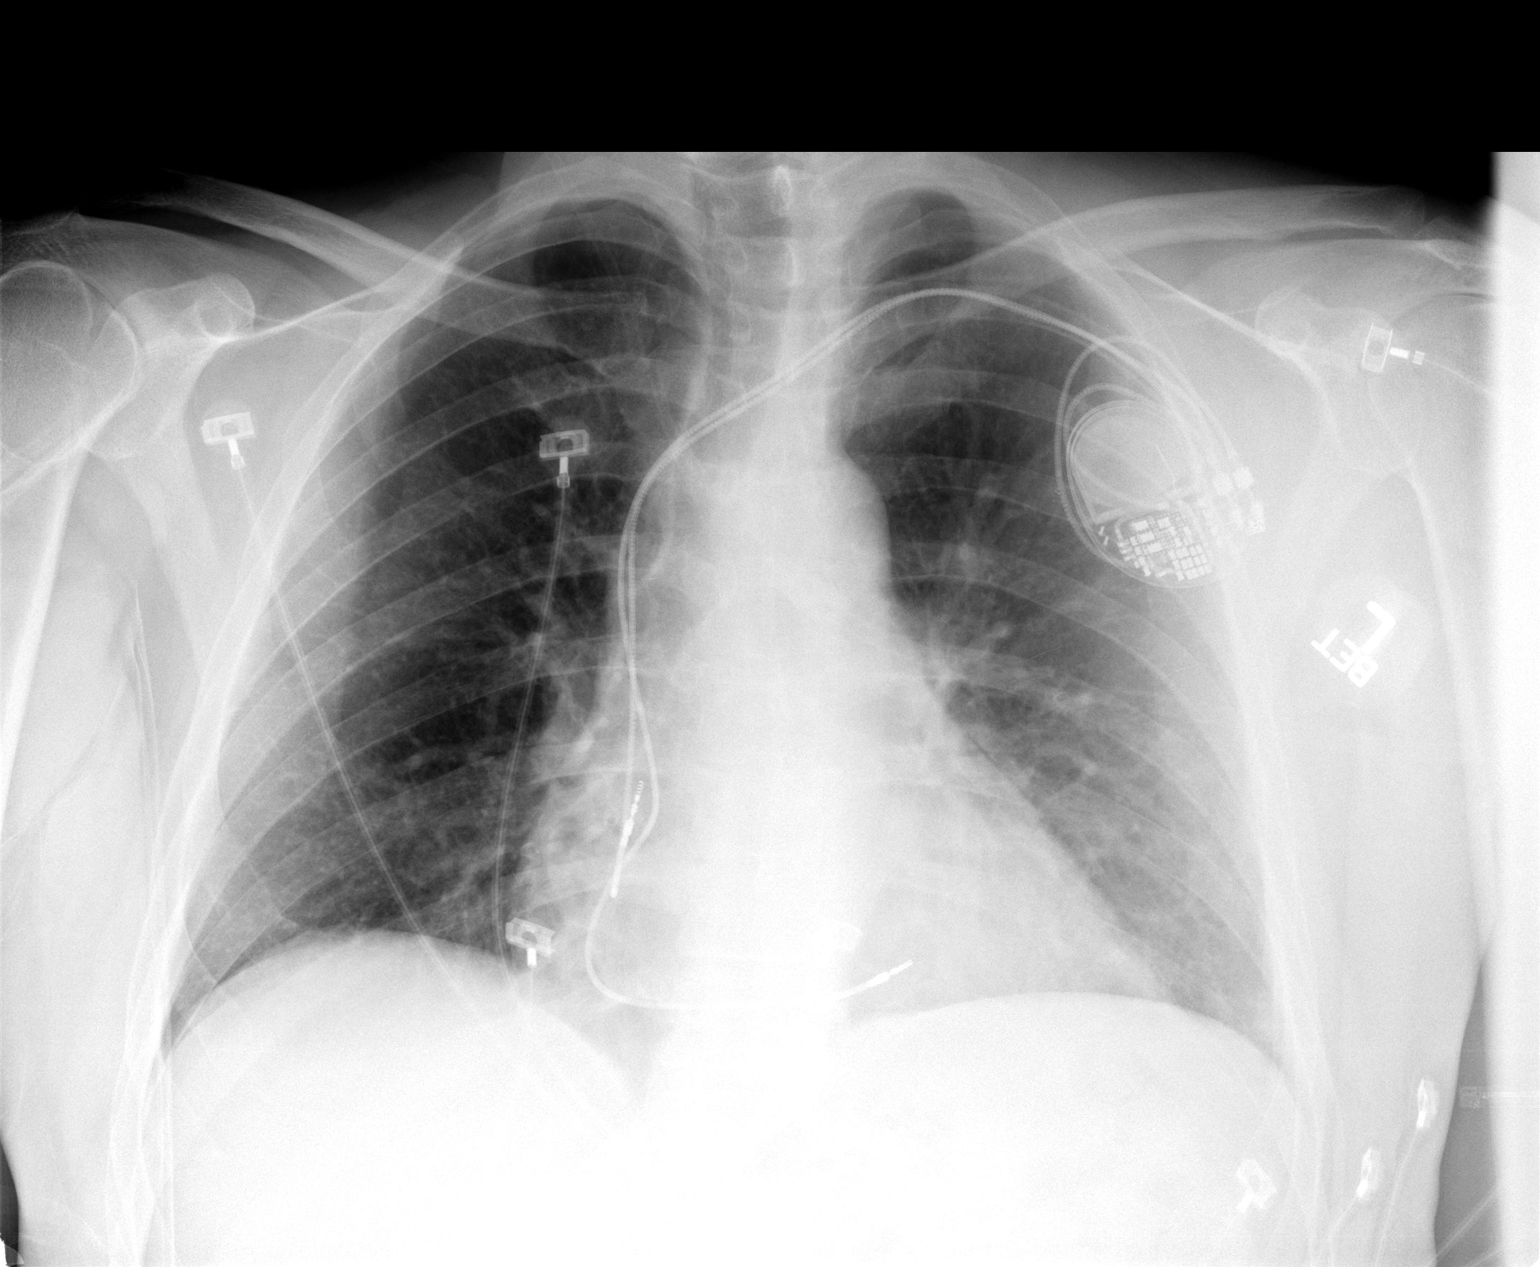

[view not recorded (2 of 2)]
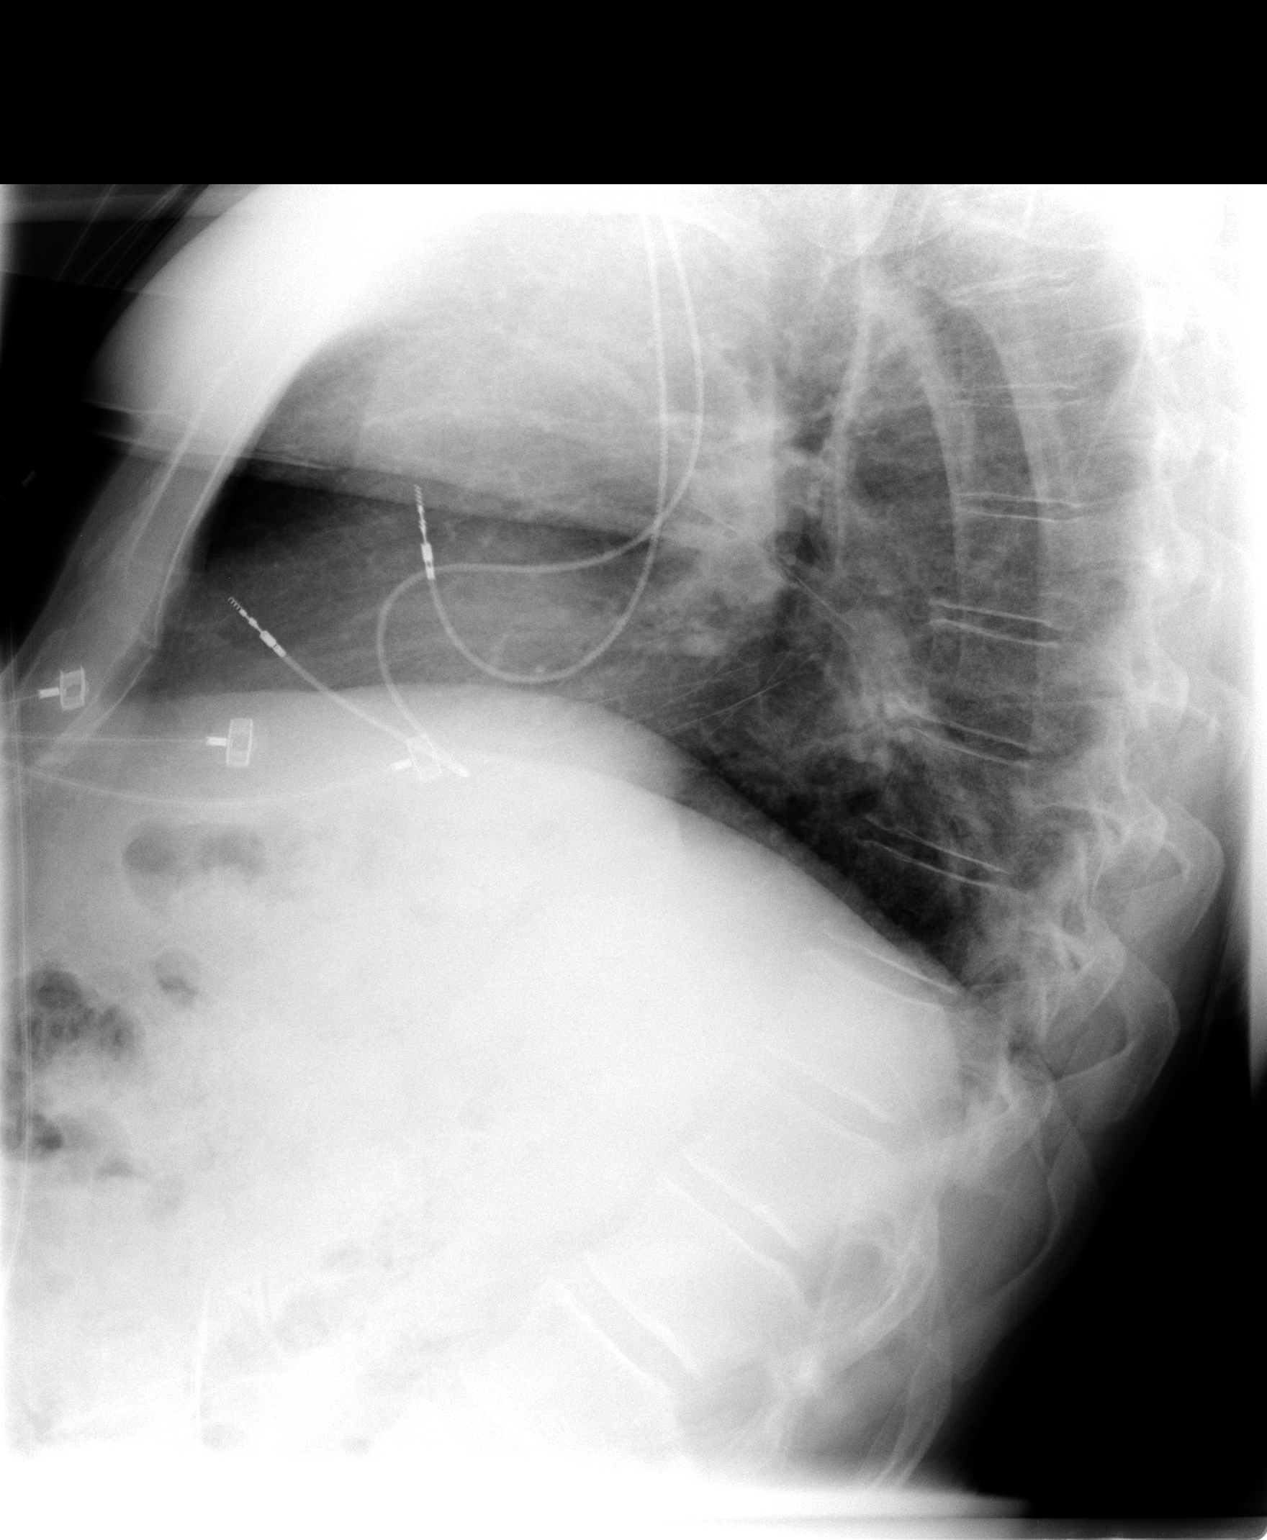

[2 of 2 positions shown; findings below may reference images not displayed]

FINDINGS: There is a dual-chamber pacer from the left. Lead have an
unremarkable orientation. No definitive cardiomegaly. Upper
mediastinal contours are unremarkable. No edema, consolidation,
effusion, or pneumothorax. A density projecting posterior and
inferior to the carina in the lateral projection is likely central
pulmonary vessels. No nodule seen in the frontal projection.
IMPRESSION: No active cardiopulmonary disease.

## 2015-06-16 IMAGING — CT CT HEAD W/O CM
1 series · 16 of 30 positions shown, 20 images · non-contrast
Comparison: None.

CLINICAL DATA: Vertigo. Nausea and vomiting. Headache. Headache for
2 days. Initial encounter.

EXAM:
CT HEAD WITHOUT CONTRAST
TECHNIQUE: Contiguous axial images were obtained from the base of the skull
through the vertex without intravenous contrast.

[Series 2: headseq 4.8 h37s · axial · 0.43mm/px · z∈[+68,+225]mm · 16 of 36 slices shown, 20 images]
[im 2/36  brain]
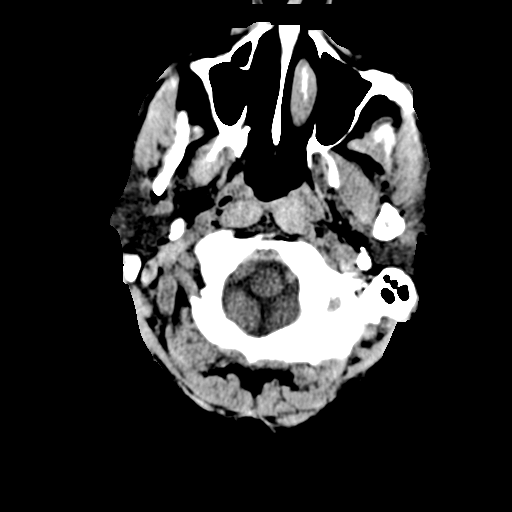
[im 2/36  bone]
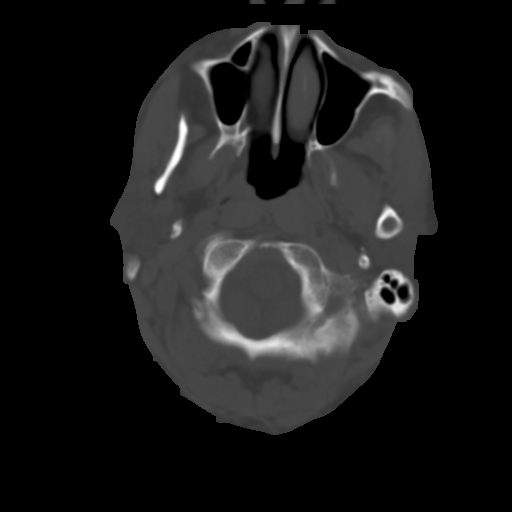
[im 4/36  brain]
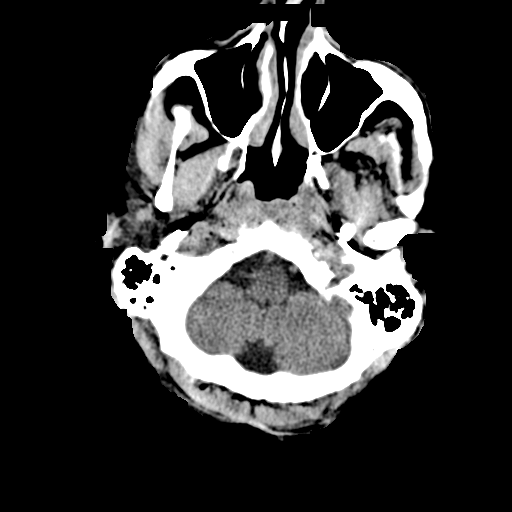
[im 7/36  brain]
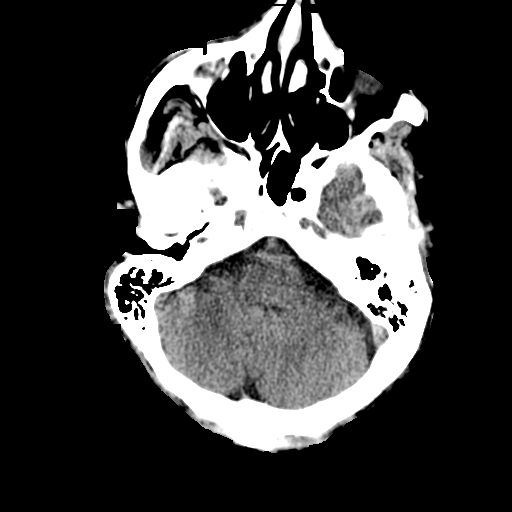
[im 9/36  brain]
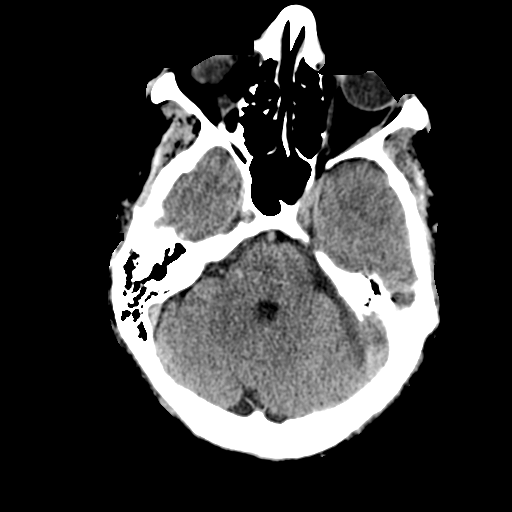
[im 10/36  brain]
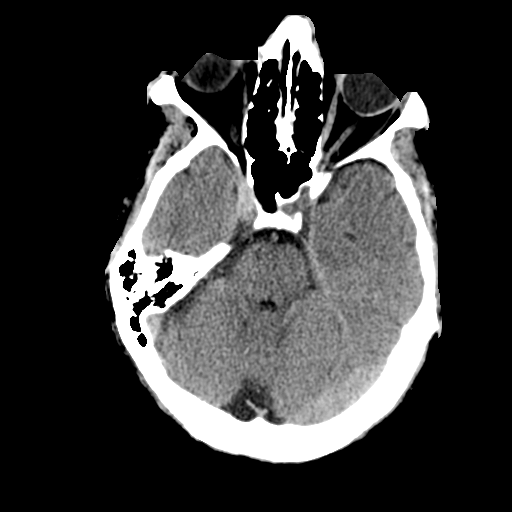
[im 10/36  bone]
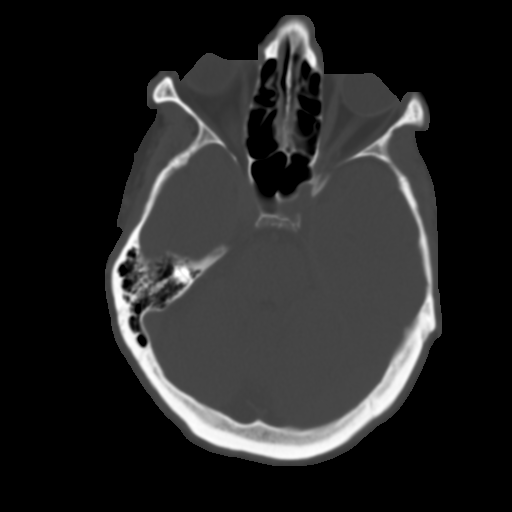
[im 13/36  brain]
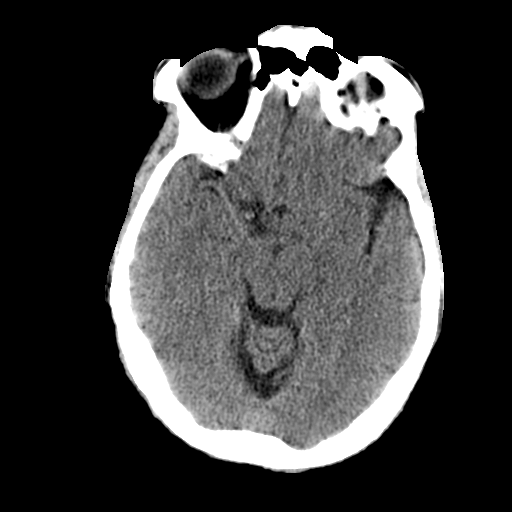
[im 15/36  brain]
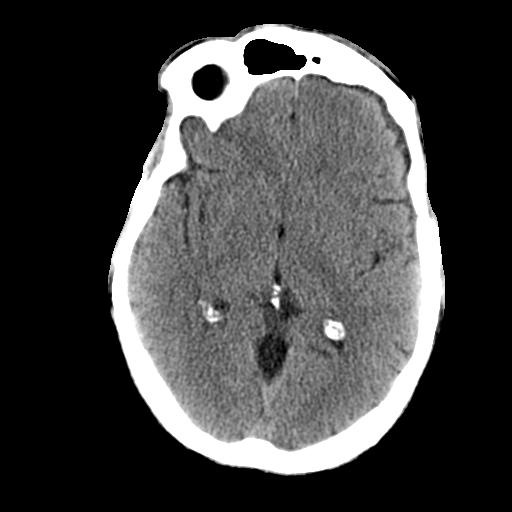
[im 17/36  brain]
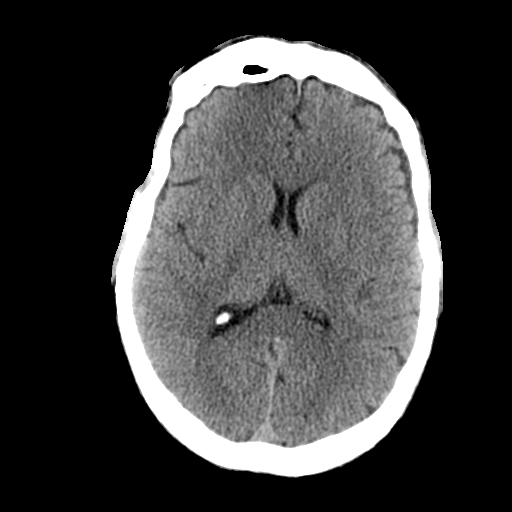
[im 19/36  brain]
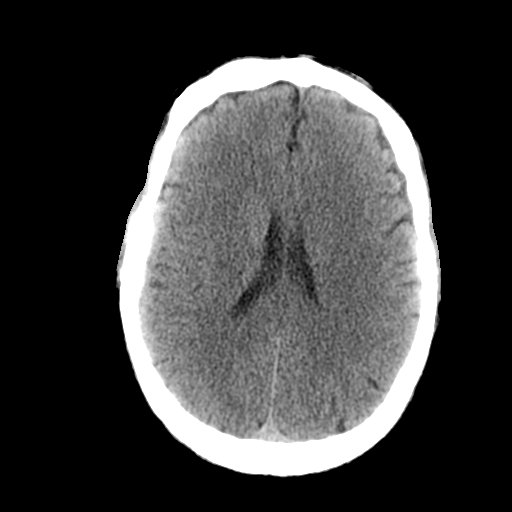
[im 19/36  bone]
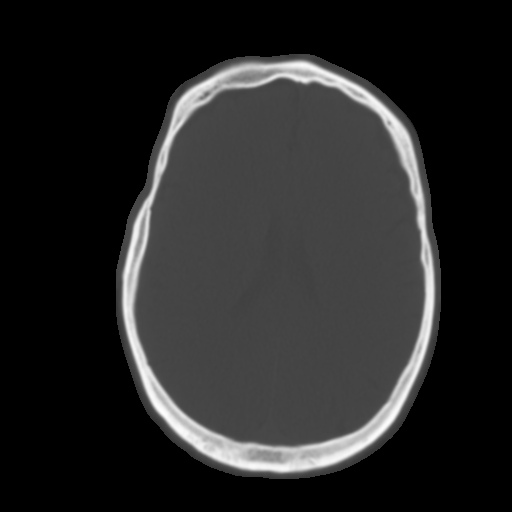
[im 21/36  brain]
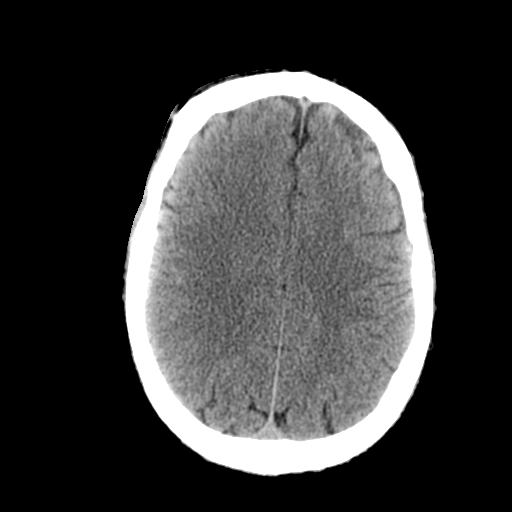
[im 23/36  brain]
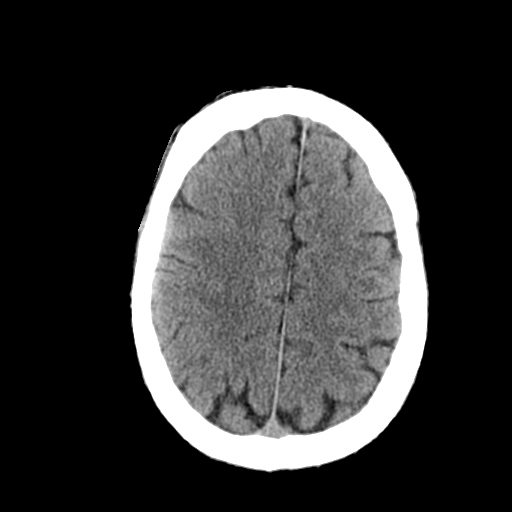
[im 26/36  brain]
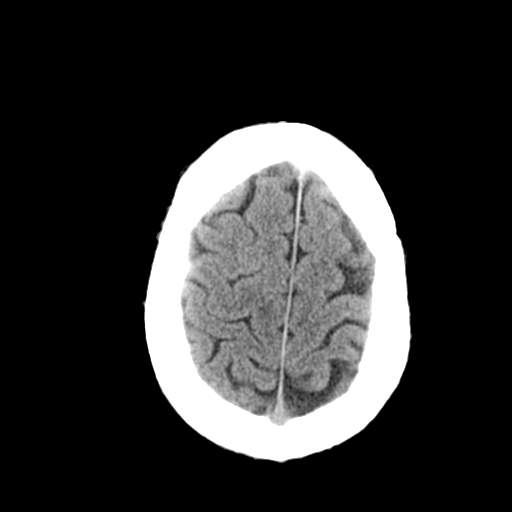
[im 27/36  brain]
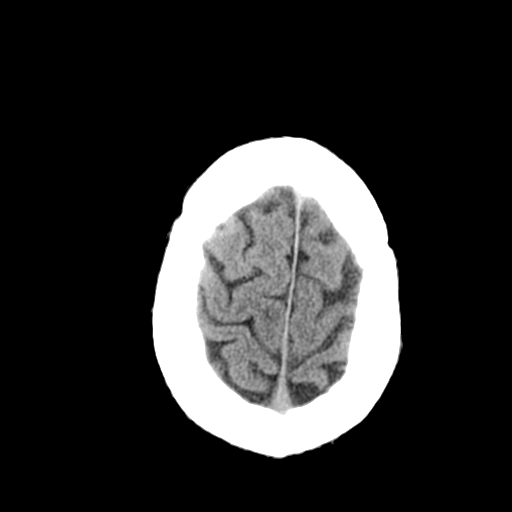
[im 27/36  bone]
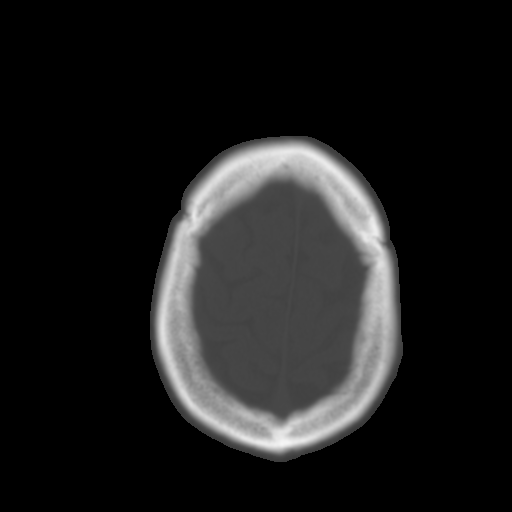
[im 29/36  brain]
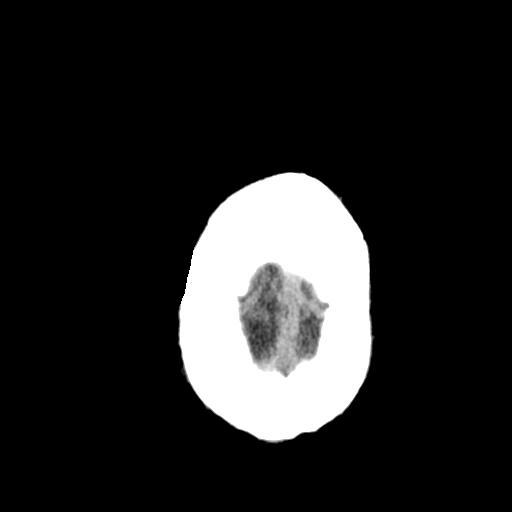
[im 32/36  brain]
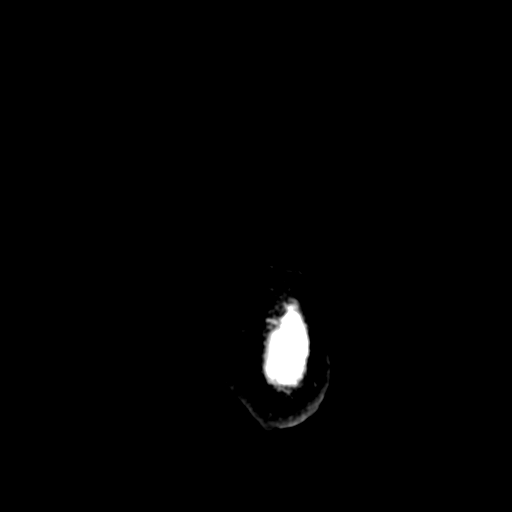
[im 34/36  brain]
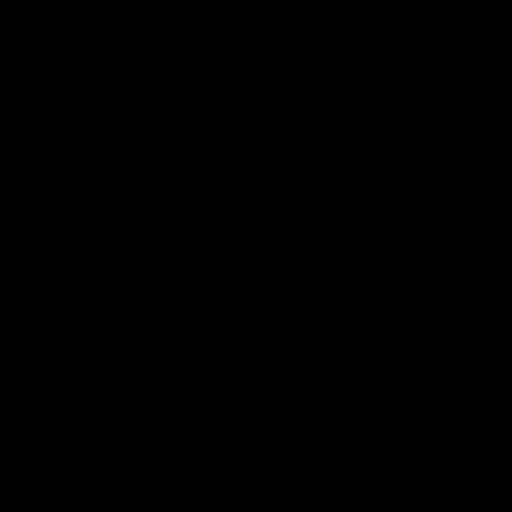

[16 of 30 positions shown; findings below may reference images not displayed]

FINDINGS: Scout images appear within normal limits. Mild soft tissue swelling
is present in the RIGHT forehead which is asymmetric. This could
represent contusion or an old injury. There is no given history of
acute trauma. There is no skull fracture. No mass lesion, mass
effect, midline shift, hydrocephalus, hemorrhage. No territorial
ischemia or acute infarction. Mastoid air cells are clear.
IMPRESSION: Negative CT head.
